# Patient Record
Sex: Male | Born: 1985 | Race: Black or African American | Hispanic: No | Marital: Single | Smoking: Never smoker
Health system: Southern US, Community
[De-identification: ages and names within clinical notes are randomized; demographics above are authoritative.]

## PROBLEM LIST (undated history)

## (undated) DIAGNOSIS — I10 Essential (primary) hypertension: Secondary | ICD-10-CM

## (undated) DIAGNOSIS — G43909 Migraine, unspecified, not intractable, without status migrainosus: Secondary | ICD-10-CM

---

## 2013-06-09 ENCOUNTER — Encounter (HOSPITAL_COMMUNITY): Payer: Self-pay | Admitting: Emergency Medicine

## 2013-06-09 ENCOUNTER — Emergency Department (HOSPITAL_COMMUNITY)
Admission: EM | Admit: 2013-06-09 | Discharge: 2013-06-09 | Disposition: A | Discharge status: Home / Self Care | Payer: Self-pay | Attending: Emergency Medicine | Admitting: Emergency Medicine

## 2013-06-09 DIAGNOSIS — R51 Headache: Secondary | ICD-10-CM | POA: Insufficient documentation

## 2013-06-09 DIAGNOSIS — R519 Headache, unspecified: Secondary | ICD-10-CM

## 2013-06-09 DIAGNOSIS — Z880 Allergy status to penicillin: Secondary | ICD-10-CM | POA: Insufficient documentation

## 2013-06-09 DIAGNOSIS — Z8679 Personal history of other diseases of the circulatory system: Secondary | ICD-10-CM | POA: Insufficient documentation

## 2013-06-09 HISTORY — DX: Migraine, unspecified, not intractable, without status migrainosus: G43.909

## 2013-06-09 MED ORDER — OXYCODONE-ACETAMINOPHEN 5-325 MG PO TABS: 1.0000 | ORAL_TABLET | ORAL | Status: DC | PRN

## 2013-06-09 NOTE — Discharge Instructions (Signed)
Do not drive within 6 hours of taking the pain medication. Use ibuprofen, when working or driving, as needed for pain. Followup with a primary care doctor if not better in one week.   Pain of Unknown Etiology (Pain Without a Known Cause) You have come to your caregiver because of pain. Pain can occur in any part of the body. Often there is not a definite cause. If your laboratory (blood or urine) work was normal and X-rays or other studies were normal, your caregiver may treat you without knowing the cause of the pain. An example of this is the headache. Most headaches are diagnosed by taking a history. This means your caregiver asks you questions about your headaches. Your caregiver determines a treatment based on your answers. Usually testing done for headaches is normal. Often testing is not done unless there is no response to medications. Regardless of where your pain is located today, you can be given medications to make you comfortable. If no physical cause of pain can be found, most cases of pain will gradually leave as suddenly as they came.  If you have a painful condition and no reason can be found for the pain, it is important that you follow up with your caregiver. If the pain becomes worse or does not go away, it may be necessary to repeat tests and look further for a possible cause.  Only take over-the-counter or prescription medicines for pain, discomfort, or fever as directed by your caregiver.  For the protection of your privacy, test results cannot be given over the phone. Make sure you receive the results of your test. Ask how these results are to be obtained if you have not been informed. It is your responsibility to obtain your test results.  You may continue all activities unless the activities cause more pain. When the pain lessens, it is important to gradually resume normal activities. Resume activities by beginning slowly and gradually increasing the intensity and duration of  the activities or exercise. During periods of severe pain, bed rest may be helpful. Lie or sit in any position that is comfortable.  Ice used for acute (sudden) conditions may be effective. Use a large plastic bag filled with ice and wrapped in a towel. This may provide pain relief.  See your caregiver for continued problems. Your caregiver can help or refer you for exercises or physical therapy if necessary. If you were given medications for your condition, do not drive, operate machinery or power tools, or sign legal documents for 24 hours. Do not drink alcohol, take sleeping pills, or take other medications that may interfere with treatment. See your caregiver immediately if you have pain that is becoming worse and not relieved by medications. Document Released: 01/14/2001 Document Revised: 02/09/2013 Document Reviewed: 04/21/2005 [facility] Patient Information 2014 [place], [place].  Emergency Department Resource Guide 1) Find a Doctor and Pay Out of Pocket Although you won't have to find out who is covered by your insurance plan, it is a good idea to ask around and get recommendations. You will then need to call the office and see if the doctor you have chosen will accept you as a new patient and what types of options they offer for patients who are self-pay. Some doctors offer discounts or will set up payment plans for their patients who do not have insurance, but you will need to ask so you aren't surprised when you get to your appointment.  2) Contact Your Local Health Department Not all  health departments have doctors that can see patients for sick visits, but many do, so it is worth a call to see if yours does. If you don't know where your local health department is, you can check in your phone book. The CDC also has a tool to help you locate your state's health department, and many state websites also have listings of all of their local health departments.  3) Find a Walk-in Clinic If your  illness is not likely to be very severe or complicated, you may want to try a walk in clinic. These are popping up all over the country in pharmacies, drugstores, and shopping centers. They're usually staffed by nurse practitioners or physician assistants that have been trained to treat common illnesses and complaints. They're usually fairly quick and inexpensive. However, if you have serious medical issues or chronic medical problems, these are probably not your best option.  No Primary Care Doctor: - Call Health Connect at  [phone] - they can help you locate a primary care doctor that  accepts your insurance, provides certain services, etc. - Physician Referral Service- [phone]  Chronic Pain Problems: Organization         Address  Phone   Notes  [facility]  [phone] Patients need to be referred by their primary care doctor.   Medication Assistance: Organization         Address  Phone   Notes  [facility] Medication [facility] [address] [place] [place], [place] [address] [phone] --Must be a resident of [facility] -- Must have NO insurance coverage whatsoever (no Medicaid/ Medicare, etc.) -- The pt. MUST have a primary care doctor that directs their care regularly and follows them in the community   MedAssist  [phone]   [employer]  [phone]    Agencies that provide inexpensive medical care: Organization         Address  Phone   Notes  [facility]  [phone]   [facility]    [phone]   [facility] [address] [place], [place] [address] [phone]   [facility] [address] [place] [place] [phone]   Planned Parenthood    [phone]   [facility]    [phone]   Community [facility] and [facility]  [address], [place] Phone:  [phone], Fax:  [phone] Hours of Operation:   9 am - 6 pm, M-F.  Also accepts Medicaid/Medicare and self-pay.  [facility]  [address], [place] Phone: [phone], Fax: [phone]. Hours of Operation:  8:30 am - 5:30 pm, M-F.  Also accepts Medicaid and self-pay.  [facility] [place] [address], [place] Phone: [phone]   [facility] [address] Natasha Bence [place], [place] [phone], Ext. [phone] Mondays & Thursdays: 7-9 AM.  First 15 patients are seen on a first come, first serve basis.    Medicaid-accepting [facility] Providers:  Organization         Address  Phone   Notes  [facility] [address], [place] [phone] Also accepts self-pay patients.  [facility] [address] Laurell Josephs [place], [place]  [phone]   [facility] [address], [place] [phone]   [facility] [address] [place] [address], [place] [phone]) [phone]   Renaye RakersVeita Bland [address], [place]   [phone] Only accepts [place] Access [place] patients after they have their name applied to their card.   Self-Pay (no insurance) in [facility]:  Organization         Address  Phone   Notes  Sickle Cell Patients, [facility] [address], [place] [phone]) [phone]   [facility] [address], [place] [phone]   [facility]  [address], [place] [phone]   [facility]/Dr. Osei-Bonsu  [address] [place] [address], [place] or [phone], [place] [phone]) [phone] Phone number for both [place] and [place] locations is the same.  [facility] [address] [place] [address], [place] [phone]   [facility] [address] [place] [address], [place] or [address] [phone]   [facility] [address], [place] [phone]) [phone], phone; [phone]) [phone], fax Sees patients 1st and 3rd Saturday of every month.  Must not qualify for public or private insurance (i.e. Medicaid, Medicare, [place] Health Choice, Veterans' Benefits)  Household income should be no more than 200% of the poverty level The clinic cannot treat you if you are pregnant or think you are pregnant  Sexually transmitted diseases are not treated at the clinic.    Dental Care: Organization         Address  Phone  Notes  [facility] [address], [place] [phone] Accepts children up to age 27 who are enrolled in [place] or [place] Health Choice; pregnant women with a Medicaid card; and children who have applied for Medicaid or [place] Health Choice, but were declined, whose parents can pay a reduced fee at time of service.  [facility] of [facility] [place]  [address], [place] [phone]) [phone] Accepts children up to age 27 who are enrolled in [place] or [place] Health Choice; pregnant women with a Medicaid card; and children who have applied for Medicaid or [place] Health Choice, but were declined, whose parents can pay a reduced fee at time of service.  [facility]  [address], [place] [phone]) [phone] Patients are seen by appointment only. Walk-ins are not accepted. [facility] will see patients 27 years of age and older. Monday - Tuesday (8am-5pm) Most Wednesdays (8:30-5pm) $30 per visit, cash only  [facility]  [address], [facility] [phone] Patients are seen by appointment only. Walk-ins are not accepted. [facility] will see patients 27 years of age and older. One Wednesday Evening (Monthly: Volunteer Based).  $30 per visit, cash only  [employer] [facility]  [phone]) [phone] for adults; Children under age 144, call Graduate Pediatric Dentistry at  [phone]) [phone]. Children aged 824-14, please call [phone]) [phone] to request a pediatric application.  Dental services are provided in all areas of dental care including fillings, crowns and bridges, complete and partial dentures, implants, gum treatment, root canals, and extractions. Preventive care is also provided. Treatment is provided to both adults and children. Patients are selected via a lottery and there is often a waiting list.   [facility] [address], [place]  [phone] [email or web address]   [facility] [address] [place], [place] [phone], Ext. [phone] Second and Fourth Thursday of each month, opens at 6:30 AM; Clinic ends at 9 AM.  Patients are seen on a first-come first-served basis, and a limited number are seen during each clinic.   [facility]  [address] Ether Griffins [place], [place] [phone]   Eligibility Requirements You must have lived in [place], [place], or [place] counties for at least the last three months.   You cannot be eligible for state or federal sponsored [place], including [employer], [place], or [employer].   You generally cannot be eligible for healthcare insurance through your employer.    How to apply: Eligibility screenings are held every Tuesday and Wednesday afternoon from 1:00 pm until 4:00 pm. You do not need an appointment for the interview!  [facility] [address], [place], [place] [phone]   [facility] Health Department  [phone]   [facility] Health Department  [phone]   [facility] Health Department  [phone]    Behavioral Health Resources in the Community: Intensive Outpatient Programs Organization         Address  Phone  Notes  [facility] [address], [place], [place] [phone]   [facility] Outpatient [address], [place], [place] [phone]   ADS: Alcohol &  Drug Svcs [address], [place], [place]  [phone]   [facility] Mental Health [address],  [place], [place] [phone] or [phone]   Substance Abuse Resources Organization         Address  Phone  Notes  Alcohol and Drug Services  [phone]   [facility]  [phone]   [facility]  [phone]   Floydene Flock  [phone]   Residential & Outpatient Substance Abuse Program  7248887015   Psychological Services Organization         Address  Phone  Notes  [facility]  [phone]   [facility]  [phone]   [facility] Mental Health [address] [place] [phone] or [phone]    Mobile Crisis Teams Organization         Address  Phone  Notes  [facility], Mobile Crisis Care Unit  [phone]   [facility]  [address] [place], [place] [phone]   Doristine Locks [address] [place] [phone]    Self-Help/Support Groups Organization         Address  Phone             Notes  [facility] [place] - variety of support groups  [phone]- I7437963 Call for more information  Narcotics Anonymous (NA), [facility] [address], [employer] [place]  2 meetings at this location   Statistician         Address  Phone  Notes  [facility] [address],    [place]  [phone]   [facility]  [address], [place] [address], [place], [place] [phone]   [facility] [address] [place], [place] [phone] Admissions: 8am-3pm M-F  [facility] [address],    [place], [place] [phone]   [facility] [address] Starling Manns [place], [place] [phone]   [facility] [address],  [place], [place] [phone]   [facility] - Intensive Outpatient [phone] Laurell Josephs  [address], [place], [place]  [phone]   [facility] ([facility]iction Recovery [facility] [address]  [place], [place] [phone] or [phone]   [facility] ([facility]) [address] [place], [place] [phone] Accepts Medicaid  [facility] [address]  [place] [phone] Substance Abuse/Addiction Treatment   [facility] Organization         Address  Phone  Notes  [facility]  [phone]   Angie Fava, PhD [address] Ervin Knack [place], [place]   [phone] or [phone]   [facility]   [address] [place], [place] [phone]   [facility] [address], [place], [place] [phone] Insurance/Medicaid/sponsorship through [facility] and Families [address]                                    [place], [place] [phone] Therapy/tele-psych/case  [facility] [address], [place] [phone]    Dr. Lolly Mustache  [phone]   [facility] [place]  [employer] [facility] Dept. 1) [address] [place] 2) [address], [place] 3)  [address], [place] [phone]  [phone]   [facility] Child Abuse Hotline [phone]) 547-8889 or [phone] (After Hours)

## 2013-06-09 NOTE — ED Provider Notes (Signed)
CSN: [id number]     Arrival date & time 06/09/13  1752 History   First MD Initiated Contact with Patient 06/09/13 1848     Chief Complaint  Patient presents with  . Migraine   (Consider location/radiation/quality/duration/timing/severity/associated sxs/prior Treatment) Patient is a 28 y.o. male presenting with migraines. The history is provided by the patient.  Migraine   He has had a headache for 3 days that waxes and wanes. No head trauma, nasal congestion, sore throat, ear pain, fever, chills, nausea or vomiting. He denies weakness, dizziness, back pain, and gait instability, paresthesia, or weakness. He has tried ibuprofen, without relief. He has had migraine headaches in the past, but none since. He was 28 years old. He reports that he has stress, but chooses not to elaborate on it. There are no other known modifying factors.  Past Medical History  Diagnosis Date  . Migraines    History reviewed. No pertinent past surgical history. History reviewed. No pertinent family history. History  Substance Use Topics  . Smoking status: Never Smoker   . Smokeless tobacco: Not on file  . Alcohol Use: No    Review of Systems  All other systems reviewed and are negative.    Allergies  Penicillins  Home Medications   Current Outpatient Rx  Name  Route  Sig  Dispense  Refill  . ibuprofen (ADVIL,MOTRIN) 200 MG tablet   Oral   Take 600 mg by mouth every 6 (six) hours as needed for headache.         . oxyCODONE-acetaminophen (PERCOCET) 5-325 MG per tablet   Oral   Take 1 tablet by mouth every 4 (four) hours as needed for severe pain.   20 tablet   0    BP 155/91  Pulse 71  Temp(Src) 98 F (36.7 C) (Oral)  Resp 16  SpO2 99% Physical Exam  Nursing note and vitals reviewed. Constitutional: He is oriented to person, place, and time. He appears well-developed and well-nourished. No distress.  HENT:  Head: Normocephalic and atraumatic.  Right Ear: External ear normal.  Left  Ear: External ear normal.  No pain to percussion over the frontal or maxillary sinuses  Eyes: Conjunctivae and EOM are normal. Pupils are equal, round, and reactive to light.  Neck: Normal range of motion and phonation normal. Neck supple.  Cardiovascular: Normal rate, regular rhythm, normal heart sounds and intact distal pulses.   Pulmonary/Chest: Effort normal and breath sounds normal. He exhibits no bony tenderness.  Abdominal: Soft. Normal appearance. There is no tenderness.  Musculoskeletal: Normal range of motion.  Neurological: He is alert and oriented to person, place, and time. No cranial nerve deficit or sensory deficit. He exhibits normal muscle tone. Coordination normal.  No dysarthria, aphasia or nystagmus. Normal gait. Romberg negative. No ataxia.  Skin: Skin is warm, dry and intact.  Psychiatric: He has a normal mood and affect. His behavior is normal. Judgment and thought content normal.    ED Course  Procedures (including critical care time)   Findings discussed with patient. All questions answered.    MDM   1. Headache      Nonspecific cephalagia without signs for meningitis, sinusitis, traumatic injury, or migraine.    Nursing Notes Reviewed/ Care Coordinated Applicable Imaging Reviewed Interpretation of Laboratory Data incorporated into ED treatment  The patient appears reasonably screened and/or stabilized for discharge and I doubt any other medical condition or other [facility] requiring further screening, evaluation, or treatment in the ED at this time  prior to discharge.  Plan: Home Medications- Ibuprofen/Percocet; Home Treatments- rest; return here if the recommended treatment, does not improve the symptoms; Recommended follow up- PCP if not better 1 week     Flint MelterElliott L Jamel Dunton, MD 06/10/13 [phone]

## 2013-06-09 NOTE — ED Notes (Signed)
Per pt, started having headache on Monday with dizziness.  Pt has hx of migraines.  Pt states took ibuprofen without relief.  States some nasal congestion noted.  No fever.

## 2020-09-20 ENCOUNTER — Other Ambulatory Visit: Payer: Self-pay

## 2020-09-20 ENCOUNTER — Encounter (HOSPITAL_COMMUNITY): Payer: Self-pay

## 2020-09-20 ENCOUNTER — Encounter: Payer: Self-pay | Admitting: Emergency Medicine

## 2020-09-20 ENCOUNTER — Ambulatory Visit
Admission: EM | Admit: 2020-09-20 | Discharge: 2020-09-20 | Disposition: A | Discharge status: Home / Self Care | Payer: Managed Care, Other (non HMO) | Attending: Internal Medicine | Admitting: Internal Medicine

## 2020-09-20 ENCOUNTER — Ambulatory Visit (INDEPENDENT_AMBULATORY_CARE_PROVIDER_SITE_OTHER): Payer: Managed Care, Other (non HMO)

## 2020-09-20 ENCOUNTER — Emergency Department (HOSPITAL_COMMUNITY)
Admission: EM | Admit: 2020-09-20 | Discharge: 2020-09-21 | Disposition: A | Discharge status: Home / Self Care | Payer: Managed Care, Other (non HMO) | Attending: Emergency Medicine | Admitting: Emergency Medicine

## 2020-09-20 DIAGNOSIS — I10 Essential (primary) hypertension: Secondary | ICD-10-CM | POA: Insufficient documentation

## 2020-09-20 DIAGNOSIS — R1030 Lower abdominal pain, unspecified: Secondary | ICD-10-CM

## 2020-09-20 DIAGNOSIS — R109 Unspecified abdominal pain: Secondary | ICD-10-CM

## 2020-09-20 DIAGNOSIS — R030 Elevated blood-pressure reading, without diagnosis of hypertension: Secondary | ICD-10-CM | POA: Diagnosis not present

## 2020-09-20 DIAGNOSIS — R1032 Left lower quadrant pain: Secondary | ICD-10-CM | POA: Insufficient documentation

## 2020-09-20 HISTORY — DX: Essential (primary) hypertension: I10

## 2020-09-20 LAB — POCT URINALYSIS DIP (MANUAL ENTRY)
Bilirubin, UA: NEGATIVE
Blood, UA: NEGATIVE
Glucose, UA: NEGATIVE mg/dL
Ketones, POC UA: NEGATIVE mg/dL
Leukocytes, UA: NEGATIVE
Nitrite, UA: NEGATIVE
Protein Ur, POC: 30 mg/dL — AB
Spec Grav, UA: >=1.03 — AB (ref 1.010–1.025)
Urobilinogen, UA: 0.2 E.U./dL
pH, UA: 5.5 (ref 5.0–8.0)

## 2020-09-20 NOTE — ED Triage Notes (Signed)
Pt sts lower abd pain x 3 days; denies other sx associated with pain; pt sts generalized HA today that is similar to migraines he has had in past; pt noted htn sts hx of same not on meds

## 2020-09-20 NOTE — ED Provider Notes (Signed)
[facility]    CSN: [id number] Arrival date & time: 09/20/20  1505      History   Chief Complaint Chief Complaint  Patient presents with  . Abdominal Pain  . Headache    HPI Cheng Dec is a 35 y.o. male who presents with intermittent sharp lower abd pains x 3 days. Has not been constipated, and denies UTI symptoms.  Has been having frequent HA's that feel like when he gets migraines. Has hx of HTN and after loosing wt to 190 lb he got off his meds. But has gained it back. His PCP is currently on leave.    Past Medical History:  Diagnosis Date  . Hypertension   . Migraines     There are no problems to display for this patient.   History reviewed. No pertinent surgical history.   Home Medications    Prior to Admission medications   Medication Sig Start Date End Date Taking? Authorizing Provider  ibuprofen (ADVIL,MOTRIN) 200 MG tablet Take 600 mg by mouth every 6 (six) hours as needed for headache.    [provider]    Family History Family History  Family history unknown: Yes    Social History Social History   Tobacco Use  . Smoking status: Never Smoker  . Smokeless tobacco: Never Used  Substance Use Topics  . Alcohol use: No  . Drug use: No     Allergies   Penicillins   Review of Systems Review of Systems  Constitutional: Negative for appetite change and fever.  Respiratory: Negative for chest tightness and shortness of breath.   Cardiovascular: Negative for chest pain.  Gastrointestinal: Positive for abdominal pain. Negative for blood in stool, constipation, nausea and vomiting.  Genitourinary: Negative for difficulty urinating and dysuria.  Musculoskeletal: Negative for myalgias.  Skin: Negative for color change, rash and wound.  Neurological: Positive for headaches.     Physical Exam Triage Vital Signs ED Triage Vitals  Enc Vitals Group     BP 09/20/20 1540 (!) 151/110     Pulse Rate 09/20/20 1540 91      Resp 09/20/20 1540 18     Temp 09/20/20 1540 98.5 F (36.9 C)     Temp Source 09/20/20 1540 Oral     SpO2 09/20/20 1540 97 %     Weight --      Height --      Head Circumference --      Peak Flow --      Pain Score 09/20/20 1541 7     Pain Loc --      Pain Edu? --      Excl. in GC? --    No data found.  Updated Vital Signs BP (!) 151/110 (BP Location: Left Arm)   Pulse 91   Temp 98.5 F (36.9 C) (Oral)   Resp 18   Wt 290 lb (131.5 kg)   SpO2 97%   Visual Acuity Right Eye Distance:   Left Eye Distance:   Bilateral Distance:    Right Eye Near:   Left Eye Near:    Bilateral Near:     Physical Exam Constitutional:      Appearance: He is obese.  HENT:     Head: Normocephalic.  Eyes:     Extraocular Movements: Extraocular movements intact.  Cardiovascular:     Rate and Rhythm: Normal rate and regular rhythm.     Heart sounds: No murmur heard.   Pulmonary:  Effort: Pulmonary effort is normal.  Abdominal:     General: Abdomen is protuberant. Bowel sounds are normal.     Palpations: Abdomen is soft.     Tenderness: There is abdominal tenderness in the right lower quadrant, suprapubic area and left lower quadrant. There is guarding and rebound. Positive signs include psoas sign.     Hernia: No hernia is present.  Skin:    General: Skin is warm and dry.     Findings: No rash.  Neurological:     Mental Status: He is alert and oriented to person, place, and time.  Psychiatric:        Mood and Affect: Mood normal.        Behavior: Behavior normal.    UC Treatments / Results  Labs (all labs ordered are listed, but only abnormal results are displayed) Labs Reviewed  POCT URINALYSIS DIP (MANUAL ENTRY) - Abnormal; Notable for the following components:      Result Value   Spec Grav, UA >=1.030 (*)    Protein Ur, POC =30 (*)    All other components within normal limits    EKG   Radiology DG Abd 2 Views  Result Date: 09/20/2020 CLINICAL DATA:  Lower  abdominal pain. EXAM: ABDOMEN - 2 VIEW COMPARISON:  None. FINDINGS: The bowel gas pattern is normal. There is no evidence of free air. No radio-opaque calculi or other significant radiographic abnormality is seen. IMPRESSION: Negative. Electronically Signed   By: Aram Candela M.D.   On: 09/20/2020 16:37    Procedures Procedures (including critical care time)  Medications Ordered in UC Medications - No data to display  Initial Impression / Assessment and Plan / UC Course  I have reviewed the triage vital signs and the nursing notes. Pertinent labs & imaging results that were available during my care of the patient were reviewed by me and considered in my medical decision making (see chart for details). Was sent t oER for further work up   Final Clinical Impressions(s) / UC Diagnoses   Final diagnoses:  Acute abdominal pain  Elevated blood pressure reading     Discharge Instructions     Please head to the ER to have more test done like blood work and possibly cat scan Work on weight loss since you dont want  to get back on blood pressure medication. Your blood pressure needs to be less than 140/90    ED Prescriptions    None     PDMP not reviewed this encounter.   Garey Ham, Cordelia Poche 09/20/20 2228

## 2020-09-20 NOTE — ED Triage Notes (Signed)
Pt seen at urgent care today for lower intermittent abdominal pain.  Provider at [facility] told pt to come to ER for possible CT scan for possible diverticulitis.  Pt states he has been in pain intermittently since Monday, denies fevers, denies n/v/d.  Pt denies urinary issues.  Reports headaches with hx of migraines and has had a headache since Monday as well.

## 2020-09-20 NOTE — ED Provider Notes (Signed)
Emergency Medicine Provider Triage Evaluation Note  Jared Oneal , a 35 y.o. male  was evaluated in triage.  Pt complains of abd pain.  Review of Systems  Positive: abd pain Negative: Fever, chills, n/v/d, dysuria, hematuria.  Physical Exam  BP (!) 172/103 (BP Location: Left Arm)   Pulse 89   Temp 99 F (37.2 C) (Oral)   Resp 16   Ht 5\' 11"  (1.803 m)   Wt 133.8 kg   SpO2 97%   BMI 41.14 kg/m  Gen:   Awake, no distress   Resp:  Normal effort  MSK:   Moves extremities without difficulty  Other:  Mild suprapubic tenderness  Medical Decision Making  Medically screening exam initiated at 10:45 PM.  Appropriate orders placed.  Tricia Oaxaca was informed that the remainder of the evaluation will be completed by another provider, this initial triage assessment does not replace that evaluation, and the importance of remaining in the ED until their evaluation is complete.  Intermittent mid lower abdominal pain for the past 3 to 4 days without any other significant symptoms.  Was seen at urgent care center had a UA that was negative as well as some type of x-ray that was negative.  Was recommended to come here for blood work and CT scan.  Examination unremarkable, mild discomfort to left lower abdomen.   Louanna Raw, PA-C 09/20/20 2247    [place], 09/22/20, DO 09/20/20 2324

## 2020-09-20 NOTE — Discharge Instructions (Addendum)
Please head to the ER to have more test done like blood work and possibly cat scan Work on weight loss since you dont want  to get back on blood pressure medication. Your blood pressure needs to be less than 140/90

## 2020-09-21 ENCOUNTER — Encounter (HOSPITAL_COMMUNITY): Payer: Self-pay | Admitting: Student

## 2020-09-21 ENCOUNTER — Emergency Department (HOSPITAL_COMMUNITY): Payer: Managed Care, Other (non HMO)

## 2020-09-21 LAB — URINALYSIS, ROUTINE W REFLEX MICROSCOPIC
Bilirubin Urine: NEGATIVE
Glucose, UA: NEGATIVE mg/dL
Hgb urine dipstick: NEGATIVE
Ketones, ur: NEGATIVE mg/dL
Nitrite: NEGATIVE
Protein, ur: NEGATIVE mg/dL
Specific Gravity, Urine: 1.028 (ref 1.005–1.030)
pH: 5 (ref 5.0–8.0)

## 2020-09-21 LAB — CBC WITH DIFFERENTIAL/PLATELET
Abs Immature Granulocytes: 0.04 10*3/uL (ref 0.00–0.07)
Basophils Absolute: 0.1 10*3/uL (ref 0.0–0.1)
Basophils Relative: 1 %
Eosinophils Absolute: 0.5 10*3/uL (ref 0.0–0.5)
Eosinophils Relative: 6 %
HCT: 45.3 % (ref 39.0–52.0)
Hemoglobin: 15 g/dL (ref 13.0–17.0)
Immature Granulocytes: 1 %
Lymphocytes Relative: 27 %
Lymphs Abs: 2.1 10*3/uL (ref 0.7–4.0)
MCH: 28.6 pg (ref 26.0–34.0)
MCHC: 33.1 g/dL (ref 30.0–36.0)
MCV: 86.3 fL (ref 80.0–100.0)
Monocytes Absolute: 0.8 10*3/uL (ref 0.1–1.0)
Monocytes Relative: 11 %
Neutro Abs: 4.2 10*3/uL (ref 1.7–7.7)
Neutrophils Relative %: 54 %
Platelets: 261 10*3/uL (ref 150–400)
RBC: 5.25 MIL/uL (ref 4.22–5.81)
RDW: 13.3 % (ref 11.5–15.5)
WBC: 7.6 10*3/uL (ref 4.0–10.5)
nRBC: 0 % (ref 0.0–0.2)

## 2020-09-21 LAB — COMPREHENSIVE METABOLIC PANEL
ALT: 93 U/L — ABNORMAL HIGH (ref 0–44)
AST: 54 U/L — ABNORMAL HIGH (ref 15–41)
Albumin: 4.2 g/dL (ref 3.5–5.0)
Alkaline Phosphatase: 57 U/L (ref 38–126)
Anion gap: 6 (ref 5–15)
BUN: 10 mg/dL (ref 6–20)
CO2: 28 mmol/L (ref 22–32)
Calcium: 8.8 mg/dL — ABNORMAL LOW (ref 8.9–10.3)
Chloride: 107 mmol/L (ref 98–111)
Creatinine, Ser: 1.16 mg/dL (ref 0.61–1.24)
GFR, Estimated: >60 mL/min (ref >60)
Glucose, Bld: 103 mg/dL — ABNORMAL HIGH (ref 70–99)
Potassium: 3.9 mmol/L (ref 3.5–5.1)
Sodium: 141 mmol/L (ref 135–145)
Total Bilirubin: 0.8 mg/dL (ref 0.3–1.2)
Total Protein: 7.6 g/dL (ref 6.5–8.1)

## 2020-09-21 LAB — LIPASE, BLOOD: Lipase: 32 U/L (ref 11–51)

## 2020-09-21 MED ORDER — SODIUM CHLORIDE 0.9 % IV BOLUS
1000.0000 mL | Freq: Once | INTRAVENOUS | Status: AC
  Administered 2020-09-21: 1000 mL via INTRAVENOUS

## 2020-09-21 MED ORDER — DICYCLOMINE HCL 20 MG PO TABS: 20.0000 mg | ORAL_TABLET | Freq: Three times a day (TID) | ORAL | 0 refills | Status: AC | PRN

## 2020-09-21 MED ORDER — DICYCLOMINE HCL 10 MG PO CAPS
20.0000 mg | ORAL_CAPSULE | Freq: Once | ORAL | Status: AC
  Administered 2020-09-21: 20 mg via ORAL
  Filled 2020-09-21: qty 2

## 2020-09-21 NOTE — Discharge Instructions (Addendum)
You were seen in the ER today for abdominal pain. Your labs showed that your liver function test were mildly elevated and your calcium was mildly low. Your CT scan showed diverticulosis without infection and a stone in one of your kidneys.   We are sending you home with bentyl to take every 8 hours as needed for abdominal cramping/spasms.   We have prescribed you new medication(s) today. Discuss the medications prescribed today with your pharmacist as they can have adverse effects and interactions with your other medicines including over the counter and prescribed medications. Seek medical evaluation if you start to experience new or abnormal symptoms after taking one of these medicines, seek care immediately if you start to experience difficulty breathing, feeling of your throat closing, facial swelling, or rash as these could be indications of a more serious allergic reaction  Please have your blood pressure rechecked by your primary care provider as well as this was elevated in the ER today.  Follow up with primary care within 3 days.  Return to the emergency department for new or worsening symptoms including but not limited to new or worsening pain, fever, inability to keep fluids down, inability to have a bowel movement/pass gas, blood in your stool or vomit, or any other concerns

## 2020-09-21 NOTE — ED Provider Notes (Signed)
[facility]-EMERGENCY DEPT Provider Note   CSN: [id number] Arrival date & time: 09/20/20  2123     History Chief Complaint  Patient presents with  . Abdominal Pain    Jared Oneal is a 35 y.o. male with a hx of hypertension & migraines who presents to the ED from urgent care for evaluation of abdominal pain for the past 3 days.  Patient states pain is to the lower abdomen, more so to the left side, it is constant, crampy in nature, worse with movement, alleviated mildly after having a bowel movement.  He was seen in urgent care and sent to the emergency department for labs and possible CT imaging.  He denies fever, chills, nausea, vomiting, diarrhea, melena, hematochezia, dysuria, or testicular pain/swelling.  HPI     Past Medical History:  Diagnosis Date  . Hypertension   . Migraines     There are no problems to display for this patient.   History reviewed. No pertinent surgical history.     Family History  Family history unknown: Yes    Social History   Tobacco Use  . Smoking status: Never Smoker  . Smokeless tobacco: Never Used  Substance Use Topics  . Alcohol use: No  . Drug use: No    Home Medications Prior to Admission medications   Medication Sig Start Date End Date Taking? Authorizing Provider  ibuprofen (ADVIL,MOTRIN) 200 MG tablet Take 600 mg by mouth every 6 (six) hours as needed for headache.    [provider]    Allergies    Penicillins  Review of Systems   Review of Systems  Constitutional: Negative for chills and fever.  Respiratory: Negative for shortness of breath.   Cardiovascular: Negative for chest pain.  Gastrointestinal: Positive for abdominal pain. Negative for anal bleeding, blood in stool, constipation, diarrhea, nausea and vomiting.  Genitourinary: Negative for dysuria, scrotal swelling and testicular pain.  Neurological: Negative for syncope.  All other systems reviewed and are  negative.   Physical Exam Updated Vital Signs BP (!) 163/111   Pulse 70   Temp 98.5 F (36.9 C) (Oral)   Resp 16   Ht 5\' 11"  (1.803 m)   Wt 133.8 kg   SpO2 99%   BMI 41.14 kg/m   Physical Exam Vitals and nursing note reviewed.  Constitutional:      General: He is not in acute distress.    Appearance: He is well-developed. He is not toxic-appearing.  HENT:     Head: Normocephalic and atraumatic.  Eyes:     General:        Right eye: No discharge.        Left eye: No discharge.     Conjunctiva/sclera: Conjunctivae normal.  Cardiovascular:     Rate and Rhythm: Normal rate and regular rhythm.  Pulmonary:     Effort: Pulmonary effort is normal. No respiratory distress.     Breath sounds: Normal breath sounds. No wheezing, rhonchi or rales.  Abdominal:     General: There is no distension.     Palpations: Abdomen is soft.     Tenderness: There is abdominal tenderness in the left lower quadrant. There is no guarding or rebound.  Musculoskeletal:     Cervical back: Neck supple.  Skin:    General: Skin is warm and dry.     Findings: No rash.  Neurological:     Mental Status: He is alert.     Comments: Clear speech.   Psychiatric:  Behavior: Behavior normal.     ED Results / Procedures / Treatments   Labs (all labs ordered are listed, but only abnormal results are displayed) Labs Reviewed  COMPREHENSIVE METABOLIC PANEL - Abnormal; Notable for the following components:      Result Value   Glucose, Bld 103 (*)    Calcium 8.8 (*)    AST 54 (*)    ALT 93 (*)    All other components within normal limits  URINALYSIS, ROUTINE W REFLEX MICROSCOPIC - Abnormal; Notable for the following components:   APPearance HAZY (*)    Leukocytes,Ua TRACE (*)    Bacteria, UA RARE (*)    All other components within normal limits  CBC WITH DIFFERENTIAL/PLATELET  LIPASE, BLOOD    EKG None  Radiology CT Abdomen Pelvis Wo Contrast  Result Date: 09/21/2020 CLINICAL DATA:   Left lower quadrant pain x5 days EXAM: CT ABDOMEN AND PELVIS WITHOUT CONTRAST TECHNIQUE: Multidetector CT imaging of the abdomen and pelvis was performed following the standard protocol without IV contrast. COMPARISON:  Abdominal radiograph Sep 20, 2020 FINDINGS: Lower chest: No acute abnormality. Hepatobiliary: Unremarkable noncontrast appearance of the hepatic parenchyma. Gallbladder is unremarkable. No biliary ductal dilation. Pancreas: Within normal limits. Spleen: Within normal limits. Adrenals/Urinary Tract: Bilateral adrenal glands are unremarkable. No hydronephrosis. Nonobstructive 3 mm left interpolar renal stone. No contour deforming renal masses. Urinary bladder is grossly unremarkable for degree of distension. Stomach/Bowel: Stomach is grossly unremarkable. No pathologic dilation of small bowel. Appendix is unremarkable. Colonic diverticulosis without acute diverticulitis. Vascular/Lymphatic: No significant vascular findings are present. No enlarged abdominal or pelvic lymph nodes. Reproductive: Prostate is unremarkable. Other: No abdominopelvic ascites. Musculoskeletal: No acute osseous abnormality. IMPRESSION: 1. No evidence of acute intra-abdominal or pelvic pathology. 2. Colonic diverticulosis without acute diverticulitis. 3. Nonobstructive 3 mm left renal stone. Electronically Signed   By: Maudry Mayhew MD   On: 09/21/2020 03:32   DG Abd 2 Views  Result Date: 09/20/2020 CLINICAL DATA:  Lower abdominal pain. EXAM: ABDOMEN - 2 VIEW COMPARISON:  None. FINDINGS: The bowel gas pattern is normal. There is no evidence of free air. No radio-opaque calculi or other significant radiographic abnormality is seen. IMPRESSION: Negative. Electronically Signed   By: Aram Candela M.D.   On: 09/20/2020 16:37    Procedures Procedures   Medications Ordered in ED Medications  sodium chloride 0.9 % bolus 1,000 mL (1,000 mLs Intravenous New Bag/Given 09/21/20 0316)  dicyclomine (BENTYL) capsule 20 mg (20  mg Oral Given 09/21/20 0316)    ED Course  I have reviewed the triage vital signs and the nursing notes.  Pertinent labs & imaging results that were available during my care of the patient were reviewed by me and considered in my medical decision making (see chart for details).    MDM Rules/Calculators/A&P                          Patient presents to the ED with complaints of abdominal pain. Patient nontoxic appearing, in no apparent distress, vitals WNL with the exception of elevated BP- doubt HTN emergency. On exam patient tender to LLQ mildly, no peritoneal signs. Will evaluate with labs and CT A/P.   Additional history obtained:  Additional history obtained from chart review & nursing note review.   Lab Tests:  I reviewed, and interpreted labs, which included:  CBC: Unremarkable CMP: Mildly elevated LFTs & mild hypocalcemia Lipase: WNL UA: Not grossly infected, no urinary sxs.  Imaging Studies ordered:  I ordered imaging studies which included CT A/P, I independently reviewed, formal radiology impression shows: 1. No evidence of acute intra-abdominal or pelvic pathology. 2. Colonic diverticulosis without acute diverticulitis. 3. Nonobstructive 3 mm left renal stone  ED Course:   05:00: RE-EVAL: Patient resting comfortably, states he feels much better, tolerating p.o.  On repeat abdominal exam patient remains without peritoneal signs, low suspicion for cholecystitis, pancreatitis, diverticulitis, appendicitis, bowel obstruction/perforation,  or other acute surgical process.  Will discharge home with supportive measures. I discussed results, treatment plan, need for PCP follow-up, and return precautions with the patient. Provided opportunity for questions, patient confirmed understanding and is in agreement with plan.    Portions of this note were generated with Scientist, clinical (histocompatibility and immunogenetics). Dictation errors may occur despite best attempts at proofreading.  Final Clinical  Impression(s) / ED Diagnoses Final diagnoses:  Left lower quadrant abdominal pain    Rx / DC Orders ED Discharge Orders         Ordered    dicyclomine (BENTYL) 20 MG tablet  Every 8 hours PRN        09/21/20 0555           Cherly Anderson, PA-C 09/21/20 8295    Sabas Sous, MD 09/21/20 0630

## 2022-07-20 IMAGING — CT CT ABD-PELV W/O CM
2 of 4 series · 17 of 46 positions shown, 19 images · non-contrast
Comparison: Abdominal radiograph September 20, 2020

CLINICAL DATA: Left lower quadrant pain x5 days

EXAM:
CT ABDOMEN AND PELVIS WITHOUT CONTRAST
TECHNIQUE: Multidetector CT imaging of the abdomen and pelvis was performed
following the standard protocol without IV contrast.

[Series 2: axial st · axial · 0.83mm/px · z∈[+1141,+1566]mm · 14 of 95 slices shown, 16 images]
[im 5/95  soft-tissue]
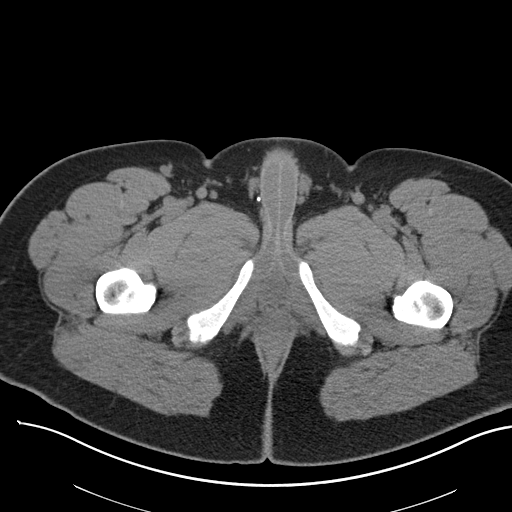
[im 5/95  bone]
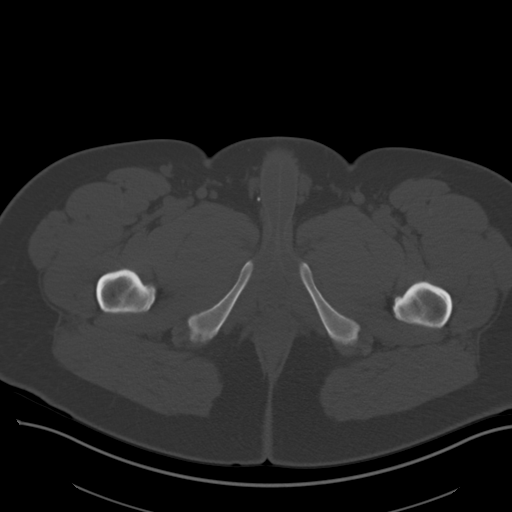
[im 10/95  soft-tissue]
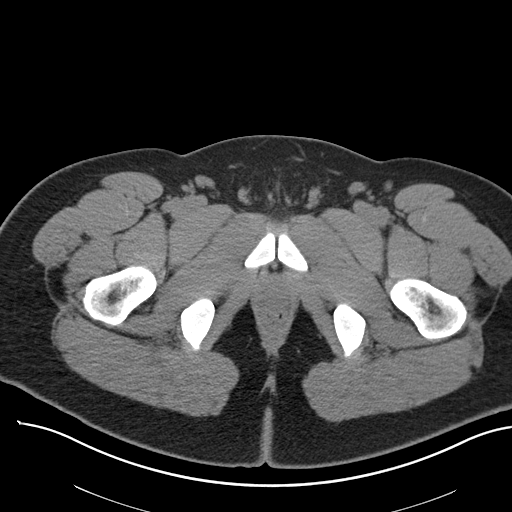
[im 20/95  soft-tissue]
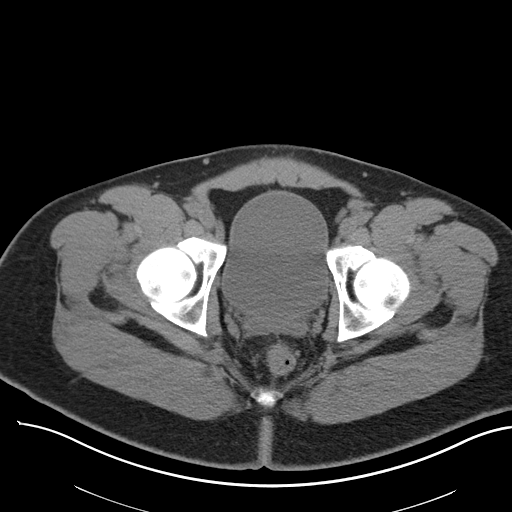
[im 25/95  soft-tissue]
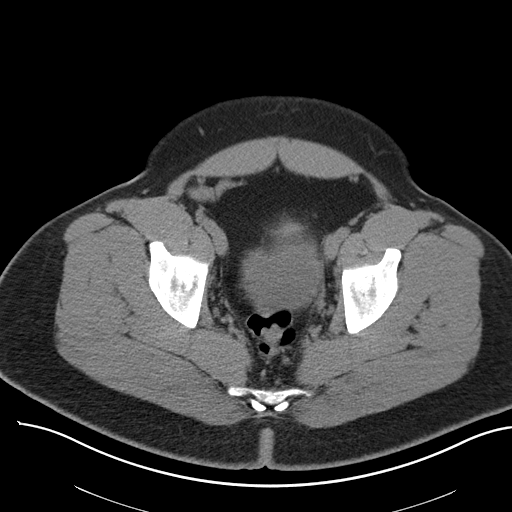
[im 30/95  soft-tissue]
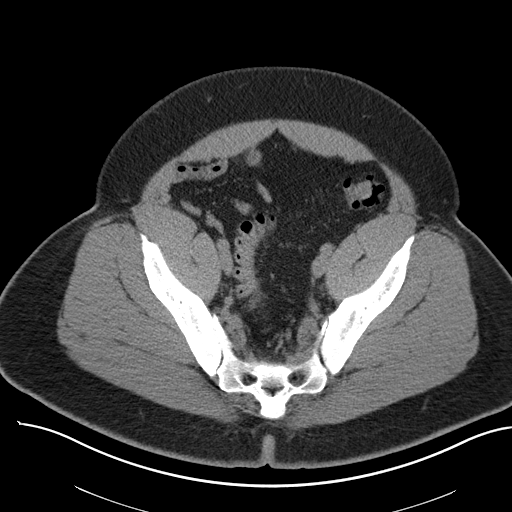
[im 40/95  soft-tissue]
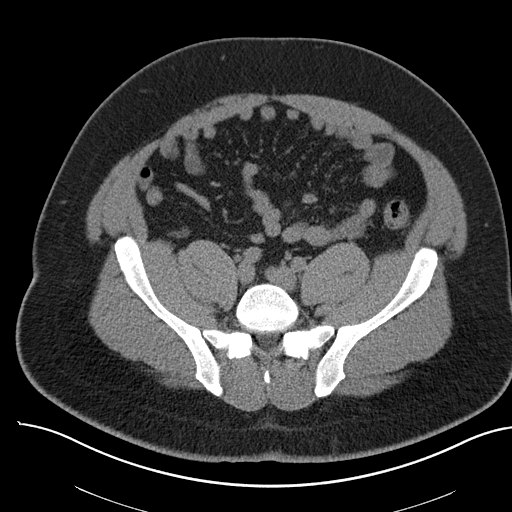
[im 45/95  soft-tissue]
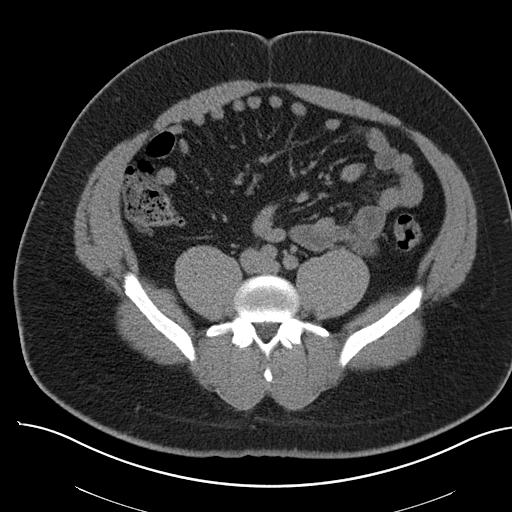
[im 50/95  soft-tissue]
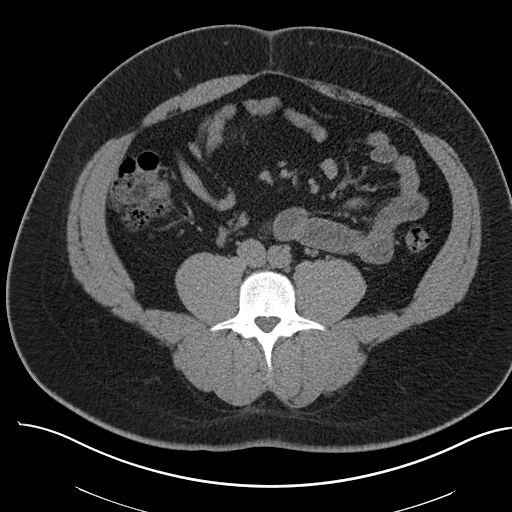
[im 55/95  soft-tissue]
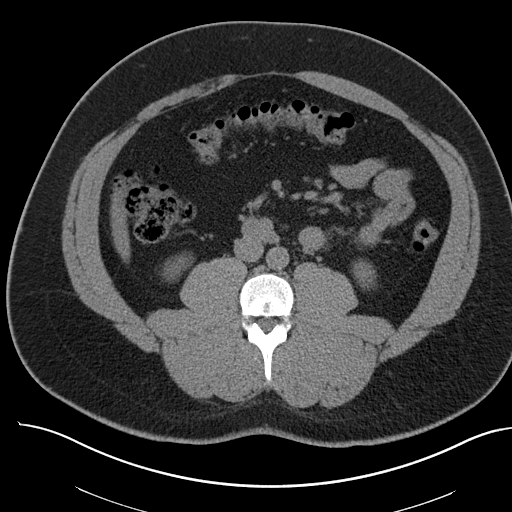
[im 55/95  bone]
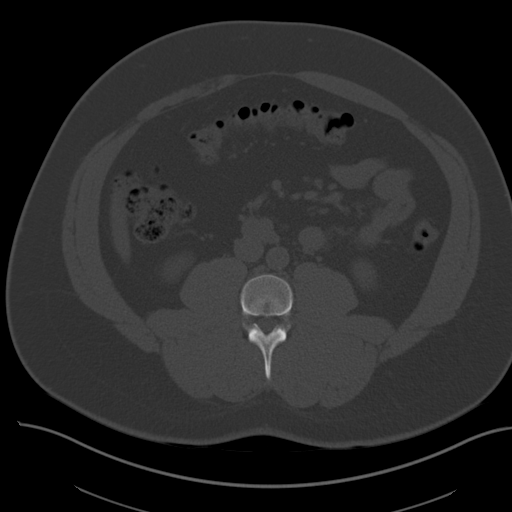
[im 65/95  soft-tissue]
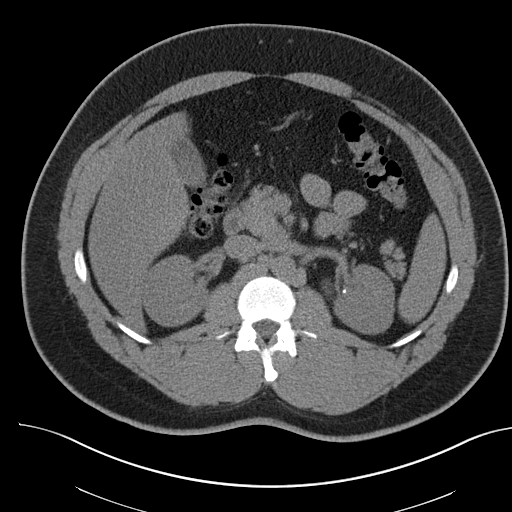
[im 70/95  soft-tissue]
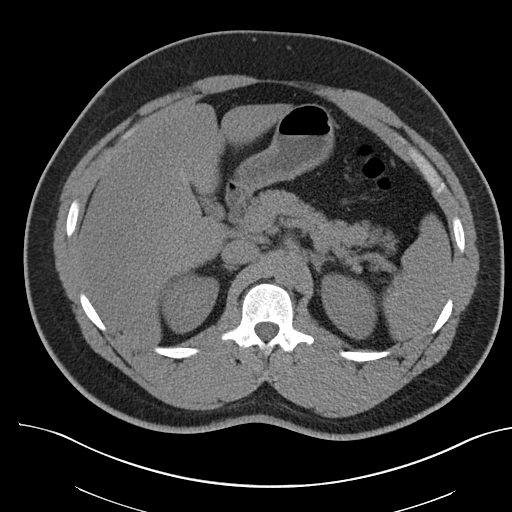
[im 75/95  soft-tissue]
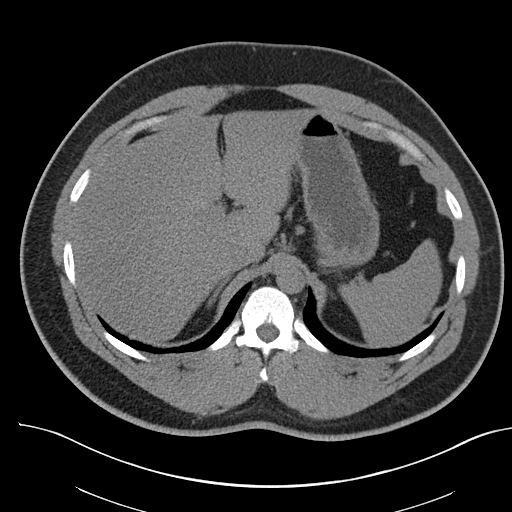
[im 85/95  soft-tissue]
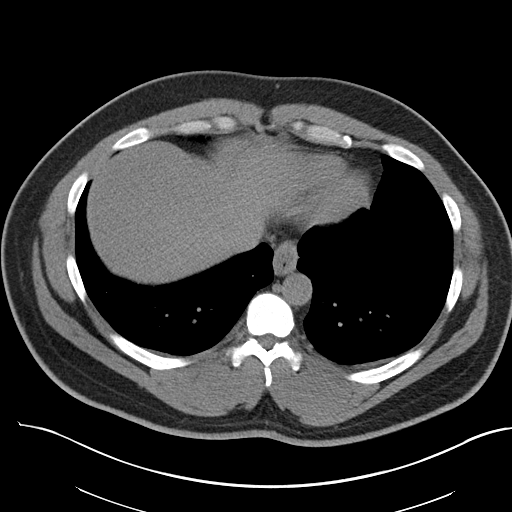
[im 90/95  soft-tissue]
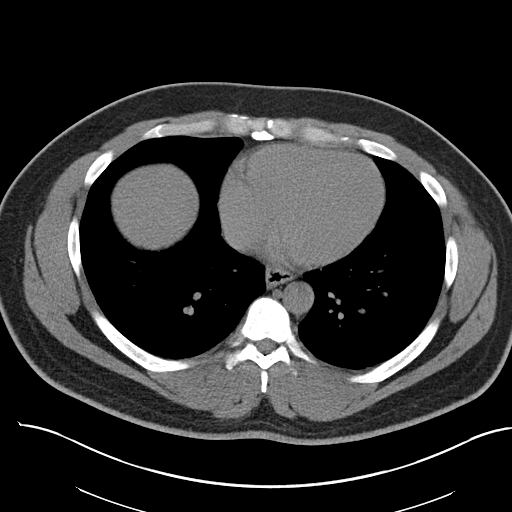

[Series 4: coronal st · coronal · 0.80mm/px · 3 of 170 slices shown]
[im 57/170  soft-tissue]
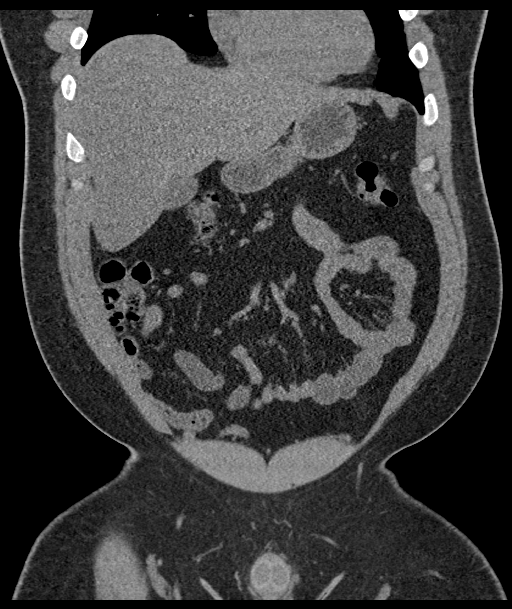
[im 76/170  soft-tissue]
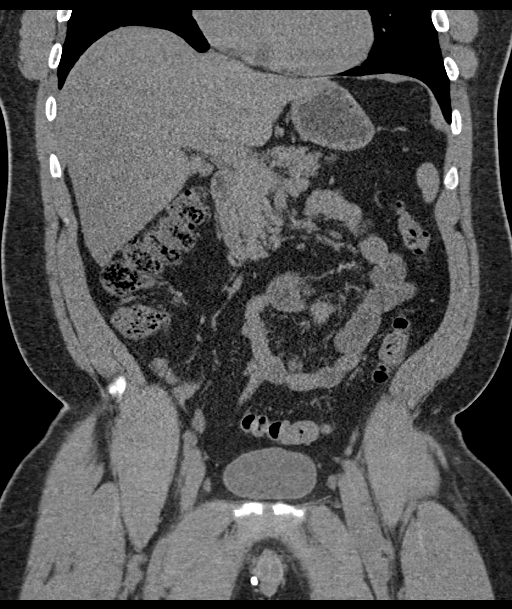
[im 94/170  soft-tissue]
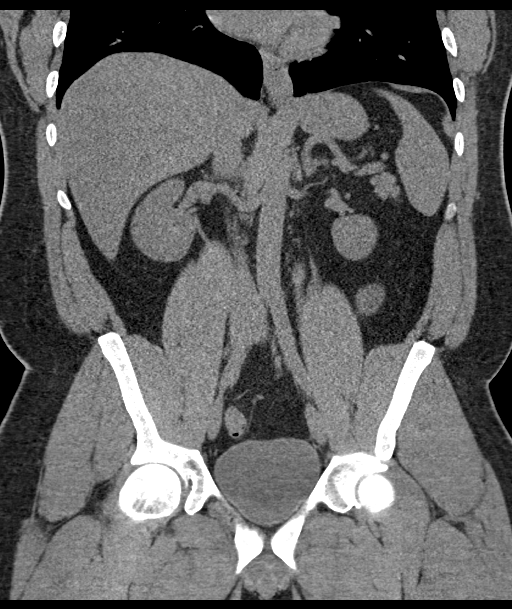

[17 of 46 positions shown; findings below may reference images not displayed]

FINDINGS: Lower chest: No acute abnormality.

Hepatobiliary: Unremarkable noncontrast appearance of the hepatic
parenchyma. Gallbladder is unremarkable. No biliary ductal dilation.

Pancreas: Within normal limits.

Spleen: Within normal limits.

Adrenals/Urinary Tract: Bilateral adrenal glands are unremarkable.

No hydronephrosis. Nonobstructive 3 mm left interpolar renal stone.
No contour deforming renal masses.

Urinary bladder is grossly unremarkable for degree of distension.

Stomach/Bowel: Stomach is grossly unremarkable. No pathologic
dilation of small bowel. Appendix is unremarkable. Colonic
diverticulosis without acute diverticulitis.

Vascular/Lymphatic: No significant vascular findings are present. No
enlarged abdominal or pelvic lymph nodes.

Reproductive: Prostate is unremarkable.

Other: No abdominopelvic ascites.

Musculoskeletal: No acute osseous abnormality.
IMPRESSION: 1. No evidence of acute intra-abdominal or pelvic pathology.
2. Colonic diverticulosis without acute diverticulitis.
3. Nonobstructive 3 mm left renal stone.

## 2023-10-19 ENCOUNTER — Ambulatory Visit
Admission: EM | Admit: 2023-10-19 | Discharge: 2023-10-19 | Disposition: A | Discharge status: Home / Self Care | Payer: Self-pay

## 2023-10-19 ENCOUNTER — Other Ambulatory Visit: Payer: Self-pay

## 2023-10-19 ENCOUNTER — Encounter (HOSPITAL_COMMUNITY): Payer: Self-pay

## 2023-10-19 ENCOUNTER — Emergency Department (HOSPITAL_COMMUNITY)
Admission: EM | Admit: 2023-10-19 | Discharge: 2023-10-20 | Disposition: A | Discharge status: Home / Self Care | Payer: Self-pay | Attending: Emergency Medicine | Admitting: Emergency Medicine

## 2023-10-19 DIAGNOSIS — R519 Headache, unspecified: Secondary | ICD-10-CM | POA: Insufficient documentation

## 2023-10-19 DIAGNOSIS — R112 Nausea with vomiting, unspecified: Secondary | ICD-10-CM

## 2023-10-19 DIAGNOSIS — I160 Hypertensive urgency: Secondary | ICD-10-CM

## 2023-10-19 MED ORDER — KETOROLAC TROMETHAMINE 30 MG/ML IJ SOLN
30.0000 mg | Freq: Once | INTRAMUSCULAR | Status: AC
  Administered 2023-10-19: 30 mg via INTRAMUSCULAR
  Filled 2023-10-19: qty 1

## 2023-10-19 MED ORDER — ACETAMINOPHEN 500 MG PO TABS
1000.0000 mg | ORAL_TABLET | Freq: Once | ORAL | Status: AC
  Filled 2023-10-19: qty 2

## 2023-10-19 MED ORDER — DIPHENHYDRAMINE HCL 25 MG PO CAPS
25.0000 mg | ORAL_CAPSULE | Freq: Once | ORAL | Status: AC
  Administered 2023-10-19: 25 mg via ORAL
  Filled 2023-10-19: qty 1

## 2023-10-19 MED ORDER — PROCHLORPERAZINE MALEATE 5 MG PO TABS
10.0000 mg | ORAL_TABLET | Freq: Once | ORAL | Status: AC
  Administered 2023-10-19: 10 mg via ORAL
  Filled 2023-10-19: qty 2

## 2023-10-19 NOTE — ED Triage Notes (Signed)
 Pt arrived POV from urgent care c/o a migraine that started last night around 10pm and has been constant. Pt does endorses photosensitivity and nausea and vomiting

## 2023-10-19 NOTE — ED Triage Notes (Signed)
 Pt reports headache onset at 10:00 pm last night, headache started off in the front of the head and is now in the right, nausea and vomiting.

## 2023-10-19 NOTE — ED Provider Triage Note (Signed)
 Emergency Medicine Provider Triage Evaluation Note  Jared Oneal , a 38 y.o. male  was evaluated in triage.  Pt complains of headache that began around 10 PM last night.  Patient with history of same and states that this feels like his usual headache  Review of Systems  Positive: Photophobia, nausea, vomiting Negative: Numbness, weakness, tinnitus, diplopia, neck stiffness  Physical Exam  BP (!) 157/119 (BP Location: Right Arm)   Pulse 78   Temp 98.7 F (37.1 C)   Resp 14   Ht 5' 11 (1.803 m)   Wt 131.5 kg   SpO2 99%   BMI 40.45 kg/m  Gen:   Awake, no distress   Resp:  Normal effort  MSK:   Moves extremities without difficulty  Other:  No facial droop or neurodeficits  Medical Decision Making  Medically screening exam initiated at 8:14 PM.  Appropriate orders placed.  Jared Oneal was informed that the remainder of the evaluation will be completed by another provider, this initial triage assessment does not replace that evaluation, and the importance of remaining in the ED until their evaluation is complete.  Medications ordered   Jared Charity, PA-C 10/19/23 2015

## 2023-10-19 NOTE — ED Provider Notes (Signed)
 Patient presents to urgent care for evaluation of headache that started last night.  Blood pressure is dangerously elevated in office.  He notes that other symptoms include nausea with 1 episode of vomiting.  He states he might have had some diarrhea yesterday.  He denies any chest pain, shortness of breath, dizziness or lightheadedness.  Recommended further evaluation in the emergency room patient is agreeable with plan.  He refuses EMS transport and would like to transport himself POV to local emergency room for further evaluation.   Vernestine Gondola, PA-C 10/19/23 1911

## 2023-10-19 NOTE — ED Notes (Signed)
 Patient is being discharged from the Urgent Care and sent to the Emergency Department via POV . Per Leonor Ramsay, patient is in need of higher level of care due to hypertensive emergency and presence of worsening headache. Patient is aware and verbalizes understanding of plan of care. Declined EMS transport.  Vitals:   10/19/23 1857 10/19/23 1903  BP: (!) 161/117 (!) 169/98  Pulse: 85   Resp: 18   Temp: 98.6 F (37 C)   SpO2: 97%

## 2023-10-20 NOTE — ED Provider Notes (Signed)
 [facility] DEPT [facility] Emergency Department Provider Note MRN:  [id number]  Arrival date & time: 10/20/23     Chief Complaint   Migraine   History of Present Illness   Jared Oneal is a 38 y.o. year-old male presents to the ED with chief complaint of headache.  Onset was yesterday.  States that he went to an urgent care and was referred to the ER for elevated BP.  He doesn't take any BP meds.  He denies fevers or chills.  Denies numbness, weakness, or tingling.  Denies having a PCP.  History provided by patient.   Review of Systems  Pertinent positive and negative review of systems noted in HPI.    Physical Exam   Vitals:   10/20/23 0149 10/20/23 0332  BP: (!) 151/105 (!) 140/105  Pulse: 69 70  Resp: 17 17  Temp: 97.7 F (36.5 C) (!) 97.5 F (36.4 C)  SpO2: 100% 100%    CONSTITUTIONAL:  non toxic-appearing, NAD NEURO:  Alert and oriented x 3, CN 3-12 grossly intact EYES:  eyes equal and reactive ENT/NECK:  Supple, no stridor  CARDIO:  normal rate, regular rhythm, appears well-perfused  PULM:  No respiratory distress, CTAB GI/GU:  non-distended,  MSK/SPINE:  No gross deformities, no edema, moves all extremities  SKIN:  no rash, atraumatic   *Additional and/or pertinent findings included in MDM below  Diagnostic and Interventional Summary    EKG Interpretation Date/Time:    Ventricular Rate:    PR Interval:    QRS Duration:    QT Interval:    QTC Calculation:   R Axis:      Text Interpretation:         Labs Reviewed - No data to display  No orders to display    Medications  ketorolac (TORADOL) 30 MG/ML injection 30 mg (30 mg Intramuscular Given 10/19/23 2043)  diphenhydrAMINE (BENADRYL) capsule 25 mg (25 mg Oral Given 10/19/23 2042)  prochlorperazine (COMPAZINE) tablet 10 mg (10 mg Oral Given 10/19/23 2042)  acetaminophen  (TYLENOL ) tablet 1,000 mg (1,000 mg Oral Given 10/19/23 2042)     Procedures  /  Critical Care Procedures  ED  Course and Medical Decision Making  I have reviewed the triage vital signs, the nursing notes, and pertinent available records from the EMR.  Social Determinants Affecting Complexity of Care: Patient has no clinically significant social determinants affecting this chief complaint..   ED Course:    Medical Decision Making Pt HA treated and improved while in ED.  Presentation non concerning for [facility], [facility], Meningitis, or temporal arteritis. Pt is afebrile with no focal neuro deficits, nuchal rigidity, or change in vision. Pt is to follow up with PCP to discuss prophylactic medication. Pt verbalizes understanding and is agreeable with plan to dc.   Discussed elevated BP with patient and he will follow-up with PCP for recheck of BP.  BP improved with treatment of HA.          Consultants: No consultations were needed in caring for this patient.   Treatment and Plan: Emergency department workup does not suggest an emergent condition requiring admission or immediate intervention beyond  what has been performed at this time. The patient is safe for discharge and has  been instructed to return immediately for worsening symptoms, change in  symptoms or any other concerns    Final Clinical Impressions(s) / ED Diagnoses     ICD-10-CM   1. Acute nonintractable headache, unspecified headache type  R51.9  ED Discharge Orders     None         Discharge Instructions Discussed with and Provided to Patient:   Discharge Instructions   None      Sherel Dikes, PA-C 10/20/23 0405    Earma Gloss, MD 10/20/23 [phone]
